# Patient Record
Sex: Male | Born: 1960 | Race: White | Hispanic: No | Marital: Married | Smoking: Current some day smoker
Health system: Southern US, Community
[De-identification: ages and names within clinical notes are randomized; demographics above are authoritative.]

## PROBLEM LIST (undated history)

## (undated) DIAGNOSIS — I10 Essential (primary) hypertension: Secondary | ICD-10-CM

## (undated) DIAGNOSIS — C801 Malignant (primary) neoplasm, unspecified: Secondary | ICD-10-CM

## (undated) DIAGNOSIS — E119 Type 2 diabetes mellitus without complications: Secondary | ICD-10-CM

## (undated) HISTORY — PX: OTHER SURGICAL HISTORY: SHX169

---

## 2007-06-13 ENCOUNTER — Emergency Department: Payer: Self-pay | Admitting: Emergency Medicine

## 2007-12-07 ENCOUNTER — Emergency Department: Payer: Self-pay | Admitting: Emergency Medicine

## 2007-12-07 ENCOUNTER — Other Ambulatory Visit: Payer: Self-pay

## 2007-12-23 ENCOUNTER — Emergency Department: Payer: Self-pay | Admitting: Emergency Medicine

## 2008-11-25 ENCOUNTER — Emergency Department: Payer: Self-pay | Admitting: Emergency Medicine

## 2008-12-01 ENCOUNTER — Observation Stay: Payer: Self-pay | Admitting: Internal Medicine

## 2008-12-06 ENCOUNTER — Ambulatory Visit: Payer: Self-pay | Admitting: Cardiovascular Disease

## 2011-12-25 ENCOUNTER — Ambulatory Visit: Payer: Self-pay | Admitting: Internal Medicine

## 2011-12-25 DIAGNOSIS — Z0289 Encounter for other administrative examinations: Secondary | ICD-10-CM

## 2012-05-05 ENCOUNTER — Ambulatory Visit: Payer: Self-pay | Admitting: Internal Medicine

## 2014-05-24 ENCOUNTER — Emergency Department: Payer: Self-pay | Admitting: Emergency Medicine

## 2014-05-24 LAB — CBC
HCT: 50.6 % (ref 40.0–52.0)
HGB: 17.2 g/dL (ref 13.0–18.0)
MCH: 31.5 pg (ref 26.0–34.0)
MCHC: 34 g/dL (ref 32.0–36.0)
MCV: 93 fL (ref 80–100)
Platelet: 181 10*3/uL (ref 150–440)
RBC: 5.46 10*6/uL (ref 4.40–5.90)
RDW: 13.1 % (ref 11.5–14.5)
WBC: 7.9 10*3/uL (ref 3.8–10.6)

## 2014-05-24 LAB — BASIC METABOLIC PANEL
ANION GAP: 5 — AB (ref 7–16)
BUN: 11 mg/dL (ref 7–18)
CO2: 31 mmol/L (ref 21–32)
Calcium, Total: 8.7 mg/dL (ref 8.5–10.1)
Chloride: 103 mmol/L (ref 98–107)
Creatinine: 1.42 mg/dL — ABNORMAL HIGH (ref 0.60–1.30)
EGFR (African American): >60
EGFR (Non-African Amer.): 55 — ABNORMAL LOW
Glucose: 145 mg/dL — ABNORMAL HIGH (ref 65–99)
Osmolality: 280 (ref 275–301)
Potassium: 4 mmol/L (ref 3.5–5.1)
Sodium: 139 mmol/L (ref 136–145)

## 2014-05-24 LAB — TROPONIN I

## 2014-10-23 ENCOUNTER — Other Ambulatory Visit: Payer: Self-pay

## 2014-10-23 ENCOUNTER — Encounter: Payer: Self-pay | Admitting: Emergency Medicine

## 2014-10-23 ENCOUNTER — Emergency Department: Payer: Medicare Other

## 2014-10-23 ENCOUNTER — Observation Stay
Admission: EM | Admit: 2014-10-23 | Discharge: 2014-10-25 | Disposition: A | Discharge status: Home / Self Care | Payer: Medicare Other | Attending: Internal Medicine | Admitting: Internal Medicine

## 2014-10-23 DIAGNOSIS — J9811 Atelectasis: Secondary | ICD-10-CM | POA: Diagnosis not present

## 2014-10-23 DIAGNOSIS — N289 Disorder of kidney and ureter, unspecified: Secondary | ICD-10-CM | POA: Diagnosis not present

## 2014-10-23 DIAGNOSIS — E669 Obesity, unspecified: Secondary | ICD-10-CM | POA: Insufficient documentation

## 2014-10-23 DIAGNOSIS — E876 Hypokalemia: Principal | ICD-10-CM

## 2014-10-23 DIAGNOSIS — Z7982 Long term (current) use of aspirin: Secondary | ICD-10-CM | POA: Diagnosis not present

## 2014-10-23 DIAGNOSIS — R531 Weakness: Secondary | ICD-10-CM | POA: Diagnosis present

## 2014-10-23 DIAGNOSIS — R42 Dizziness and giddiness: Secondary | ICD-10-CM | POA: Diagnosis not present

## 2014-10-23 DIAGNOSIS — R0602 Shortness of breath: Secondary | ICD-10-CM | POA: Diagnosis not present

## 2014-10-23 DIAGNOSIS — E785 Hyperlipidemia, unspecified: Secondary | ICD-10-CM | POA: Insufficient documentation

## 2014-10-23 DIAGNOSIS — R079 Chest pain, unspecified: Secondary | ICD-10-CM | POA: Diagnosis not present

## 2014-10-23 DIAGNOSIS — F17200 Nicotine dependence, unspecified, uncomplicated: Secondary | ICD-10-CM

## 2014-10-23 DIAGNOSIS — K219 Gastro-esophageal reflux disease without esophagitis: Secondary | ICD-10-CM | POA: Insufficient documentation

## 2014-10-23 DIAGNOSIS — E119 Type 2 diabetes mellitus without complications: Secondary | ICD-10-CM | POA: Diagnosis not present

## 2014-10-23 DIAGNOSIS — I10 Essential (primary) hypertension: Secondary | ICD-10-CM | POA: Diagnosis not present

## 2014-10-23 DIAGNOSIS — R0600 Dyspnea, unspecified: Secondary | ICD-10-CM | POA: Insufficient documentation

## 2014-10-23 DIAGNOSIS — G4733 Obstructive sleep apnea (adult) (pediatric): Secondary | ICD-10-CM | POA: Insufficient documentation

## 2014-10-23 DIAGNOSIS — R000 Tachycardia, unspecified: Secondary | ICD-10-CM

## 2014-10-23 DIAGNOSIS — Z859 Personal history of malignant neoplasm, unspecified: Secondary | ICD-10-CM | POA: Diagnosis not present

## 2014-10-23 HISTORY — DX: Malignant (primary) neoplasm, unspecified: C80.1

## 2014-10-23 HISTORY — DX: Essential (primary) hypertension: I10

## 2014-10-23 HISTORY — DX: Type 2 diabetes mellitus without complications: E11.9

## 2014-10-23 LAB — COMPREHENSIVE METABOLIC PANEL
ALBUMIN: 3.7 g/dL (ref 3.5–5.0)
ALK PHOS: 86 U/L (ref 38–126)
ALT: 16 U/L — ABNORMAL LOW (ref 17–63)
ANION GAP: 9 (ref 5–15)
AST: 22 U/L (ref 15–41)
BILIRUBIN TOTAL: 0.4 mg/dL (ref 0.3–1.2)
BUN: 15 mg/dL (ref 6–20)
CHLORIDE: 100 mmol/L — AB (ref 101–111)
CO2: 28 mmol/L (ref 22–32)
CREATININE: 1.28 mg/dL — AB (ref 0.61–1.24)
Calcium: 8.9 mg/dL (ref 8.9–10.3)
GFR calc Af Amer: >60 mL/min (ref >60)
GFR calc non Af Amer: >60 mL/min (ref >60)
Glucose, Bld: 304 mg/dL — ABNORMAL HIGH (ref 65–99)
Potassium: 2.5 mmol/L — CL (ref 3.5–5.1)
SODIUM: 137 mmol/L (ref 135–145)
TOTAL PROTEIN: 7.3 g/dL (ref 6.5–8.1)

## 2014-10-23 LAB — TROPONIN I

## 2014-10-23 LAB — CBC
HCT: 44.7 % (ref 40.0–52.0)
Hemoglobin: 15.7 g/dL (ref 13.0–18.0)
MCH: 32.2 pg (ref 26.0–34.0)
MCHC: 35.1 g/dL (ref 32.0–36.0)
MCV: 91.7 fL (ref 80.0–100.0)
PLATELETS: 190 10*3/uL (ref 150–440)
RBC: 4.87 MIL/uL (ref 4.40–5.90)
RDW: 13.4 % (ref 11.5–14.5)
WBC: 9.9 10*3/uL (ref 3.8–10.6)

## 2014-10-23 LAB — MAGNESIUM: MAGNESIUM: 1.6 mg/dL — AB (ref 1.7–2.4)

## 2014-10-23 MED ORDER — HEPARIN SODIUM (PORCINE) 5000 UNIT/ML IJ SOLN
INTRAMUSCULAR | Status: AC
  Administered 2014-10-24: 5000 [IU] via SUBCUTANEOUS
  Filled 2014-10-23: qty 1

## 2014-10-23 MED ORDER — INSULIN ASPART 100 UNIT/ML ~~LOC~~ SOLN
0.0000 [IU] | Freq: Every day | SUBCUTANEOUS | Status: DC
  Administered 2014-10-23: 4 [IU] via SUBCUTANEOUS
  Administered 2014-10-24: 3 [IU] via SUBCUTANEOUS
  Filled 2014-10-23: qty 4
  Filled 2014-10-23: qty 3

## 2014-10-23 MED ORDER — MORPHINE SULFATE 2 MG/ML IJ SOLN
2.0000 mg | INTRAMUSCULAR | Status: DC | PRN
  Administered 2014-10-23 – 2014-10-25 (×4): 2 mg via INTRAVENOUS
  Filled 2014-10-23 (×3): qty 1

## 2014-10-23 MED ORDER — TRAZODONE HCL 100 MG PO TABS
100.0000 mg | ORAL_TABLET | Freq: Every day | ORAL | Status: DC
  Administered 2014-10-24 (×2): 100 mg via ORAL
  Filled 2014-10-23 (×2): qty 1

## 2014-10-23 MED ORDER — MORPHINE SULFATE 2 MG/ML IJ SOLN
INTRAMUSCULAR | Status: AC
  Filled 2014-10-23: qty 1

## 2014-10-23 MED ORDER — ACETAMINOPHEN 650 MG RE SUPP: 650.0000 mg | Freq: Four times a day (QID) | RECTAL | Status: DC | PRN

## 2014-10-23 MED ORDER — ONDANSETRON HCL 4 MG/2ML IJ SOLN
4.0000 mg | Freq: Four times a day (QID) | INTRAMUSCULAR | Status: DC | PRN
  Administered 2014-10-23: 4 mg via INTRAVENOUS

## 2014-10-23 MED ORDER — SODIUM CHLORIDE 0.9 % IV BOLUS (SEPSIS)
1000.0000 mL | Freq: Once | INTRAVENOUS | Status: AC
  Administered 2014-10-23: 1000 mL via INTRAVENOUS

## 2014-10-23 MED ORDER — ASPIRIN 81 MG PO CHEW
324.0000 mg | CHEWABLE_TABLET | Freq: Once | ORAL | Status: AC
  Administered 2014-10-23: 324 mg via ORAL

## 2014-10-23 MED ORDER — NITROGLYCERIN 0.4 MG SL SUBL: 0.4000 mg | SUBLINGUAL_TABLET | SUBLINGUAL | Status: DC | PRN

## 2014-10-23 MED ORDER — GABAPENTIN 400 MG PO CAPS
800.0000 mg | ORAL_CAPSULE | Freq: Four times a day (QID) | ORAL | Status: DC
  Administered 2014-10-24 – 2014-10-25 (×6): 800 mg via ORAL
  Filled 2014-10-23 (×6): qty 2

## 2014-10-23 MED ORDER — ONDANSETRON HCL 4 MG/2ML IJ SOLN
INTRAMUSCULAR | Status: AC
  Administered 2014-10-23: 4 mg via INTRAVENOUS
  Filled 2014-10-23: qty 2

## 2014-10-23 MED ORDER — ACETAMINOPHEN 325 MG PO TABS
650.0000 mg | ORAL_TABLET | Freq: Four times a day (QID) | ORAL | Status: DC | PRN
  Administered 2014-10-24: 650 mg via ORAL
  Filled 2014-10-23: qty 2

## 2014-10-23 MED ORDER — INSULIN ASPART 100 UNIT/ML ~~LOC~~ SOLN
0.0000 [IU] | Freq: Three times a day (TID) | SUBCUTANEOUS | Status: DC
  Administered 2014-10-24 (×2): 5 [IU] via SUBCUTANEOUS
  Administered 2014-10-24: 8 [IU] via SUBCUTANEOUS
  Administered 2014-10-25: 5 [IU] via SUBCUTANEOUS
  Filled 2014-10-23: qty 8
  Filled 2014-10-23 (×3): qty 5

## 2014-10-23 MED ORDER — INSULIN ASPART 100 UNIT/ML ~~LOC~~ SOLN: 0.0000 [IU] | Freq: Every day | SUBCUTANEOUS | Status: DC

## 2014-10-23 MED ORDER — HEPARIN SODIUM (PORCINE) 5000 UNIT/ML IJ SOLN
5000.0000 [IU] | Freq: Three times a day (TID) | INTRAMUSCULAR | Status: DC
  Administered 2014-10-23 – 2014-10-25 (×6): 5000 [IU] via SUBCUTANEOUS
  Filled 2014-10-23 (×5): qty 1

## 2014-10-23 MED ORDER — ASPIRIN 81 MG PO CHEW
CHEWABLE_TABLET | ORAL | Status: AC
  Filled 2014-10-23: qty 4

## 2014-10-23 MED ORDER — ATORVASTATIN CALCIUM 20 MG PO TABS
40.0000 mg | ORAL_TABLET | Freq: Every day | ORAL | Status: DC
  Administered 2014-10-24: 40 mg via ORAL
  Filled 2014-10-23: qty 2

## 2014-10-23 MED ORDER — POTASSIUM CHLORIDE CRYS ER 20 MEQ PO TBCR
40.0000 meq | EXTENDED_RELEASE_TABLET | Freq: Once | ORAL | Status: AC
  Administered 2014-10-23: 40 meq via ORAL

## 2014-10-23 MED ORDER — POTASSIUM CHLORIDE CRYS ER 20 MEQ PO TBCR
40.0000 meq | EXTENDED_RELEASE_TABLET | ORAL | Status: AC
  Administered 2014-10-23: 40 meq via ORAL

## 2014-10-23 MED ORDER — POTASSIUM CHLORIDE CRYS ER 20 MEQ PO TBCR
EXTENDED_RELEASE_TABLET | ORAL | Status: AC
  Administered 2014-10-23: 40 meq via ORAL
  Filled 2014-10-23: qty 2

## 2014-10-23 MED ORDER — INSULIN ASPART 100 UNIT/ML ~~LOC~~ SOLN: 0.0000 [IU] | Freq: Three times a day (TID) | SUBCUTANEOUS | Status: DC

## 2014-10-23 MED ORDER — PANTOPRAZOLE SODIUM 40 MG PO TBEC
40.0000 mg | DELAYED_RELEASE_TABLET | Freq: Every day | ORAL | Status: DC
  Administered 2014-10-24 – 2014-10-25 (×2): 40 mg via ORAL
  Filled 2014-10-23 (×2): qty 1

## 2014-10-23 MED ORDER — MOMETASONE FURO-FORMOTEROL FUM 100-5 MCG/ACT IN AERO
2.0000 | INHALATION_SPRAY | Freq: Two times a day (BID) | RESPIRATORY_TRACT | Status: DC
  Administered 2014-10-24 – 2014-10-25 (×4): 2 via RESPIRATORY_TRACT
  Filled 2014-10-23: qty 8.8

## 2014-10-23 MED ORDER — ASPIRIN EC 81 MG PO TBEC
81.0000 mg | DELAYED_RELEASE_TABLET | Freq: Every day | ORAL | Status: DC
  Administered 2014-10-24 – 2014-10-25 (×2): 81 mg via ORAL
  Filled 2014-10-23 (×2): qty 1

## 2014-10-23 MED ORDER — ONDANSETRON HCL 4 MG PO TABS: 4.0000 mg | ORAL_TABLET | Freq: Four times a day (QID) | ORAL | Status: DC | PRN

## 2014-10-23 MED ORDER — IOHEXOL 350 MG/ML SOLN
100.0000 mL | Freq: Once | INTRAVENOUS | Status: AC | PRN
  Administered 2014-10-23: 75 mL via INTRAVENOUS

## 2014-10-23 MED ORDER — POTASSIUM CHLORIDE CRYS ER 20 MEQ PO TBCR
EXTENDED_RELEASE_TABLET | ORAL | Status: AC
  Filled 2014-10-23: qty 2

## 2014-10-23 MED ORDER — TRAMADOL HCL 50 MG PO TABS
50.0000 mg | ORAL_TABLET | Freq: Four times a day (QID) | ORAL | Status: DC | PRN
  Administered 2014-10-24 – 2014-10-25 (×3): 50 mg via ORAL
  Filled 2014-10-23 (×3): qty 1

## 2014-10-23 MED ORDER — SODIUM CHLORIDE 0.9 % IJ SOLN
3.0000 mL | Freq: Two times a day (BID) | INTRAMUSCULAR | Status: DC
  Administered 2014-10-23 – 2014-10-25 (×4): 3 mL via INTRAVENOUS

## 2014-10-23 MED ORDER — ASPIRIN 81 MG PO CHEW: 324.0000 mg | CHEWABLE_TABLET | Freq: Once | ORAL | Status: DC

## 2014-10-23 NOTE — ED Notes (Signed)
Critical potassium called to Dr Jacqualine Code who will be pt's provider; report also given to [facility], RN
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
## 2014-10-23 NOTE — ED Notes (Signed)
States has work up at [employer] but left without being seen.

## 2014-10-23 NOTE — H&P (Signed)
[facility] [place] NAME: Patrick Yoder    MR#:  [id number]  DATE OF BIRTH:  [birth year]   DATE OF ADMISSION:  10/23/2014  PRIMARY CARE PHYSICIAN: No primary care provider on file.   REQUESTING/REFERRING PHYSICIAN: Delman Kitten  CHIEF COMPLAINT:   Chief Complaint  Patient presents with  . Chest Pain    intermittent x several weeks    HISTORY OF PRESENT ILLNESS:  Patrick Yoder  is a 54 y.o. male with a known history of type 2 diabetes, uncomplicated, essential hypertension presenting with chest pain. Describes one-week total duration of chest pain retrosternal in location, nonexertional, pressure in quality, nonradiating, 7-8/10 intensity, no worsening or relieving factors. He had associated shortness of breath otherwise no further symptomatology. Of note patient supposed to have a stress test performed in about a week as an outpatient, has no cardiologist.  PAST MEDICAL HISTORY:   Past Medical History  Diagnosis Date  . Diabetes mellitus without complication   . Hypertension   . Cancer     PAST SURGICAL HISTORY:   Past Surgical History  Procedure Laterality Date  . Renal cancer      SOCIAL HISTORY:   History  Substance Use Topics  . Smoking status: Current Some Day Smoker  . Smokeless tobacco: Not on file  . Alcohol Use: No    FAMILY HISTORY:  No family history on file.  DRUG ALLERGIES:  No Known Allergies  REVIEW OF SYSTEMS:  REVIEW OF SYSTEMS:  CONSTITUTIONAL: Denies fevers, chills, fatigue, weakness.  EYES: Denies blurred vision, double vision, or eye pain.  EARS, NOSE, THROAT: Denies tinnitus, ear pain, hearing loss.  RESPIRATORY: denies cough, positive shortness of breath, denies wheezing  CARDIOVASCULAR: Positive chest pain, denies palpitations, edema.  GASTROINTESTINAL: Denies nausea, vomiting, diarrhea, abdominal pain.  GENITOURINARY: Denies dysuria, hematuria.  ENDOCRINE: Denies nocturia or  thyroid problems. HEMATOLOGIC AND LYMPHATIC: Denies easy bruising or bleeding.  SKIN: Denies rash or lesions.  MUSCULOSKELETAL: Denies pain in neck, back, shoulder, knees, hips, or further arthritic symptoms.  NEUROLOGIC: Denies paralysis, paresthesias.  PSYCHIATRIC: Denies anxiety or depressive symptoms. Otherwise full review of systems performed by me is negative.   MEDICATIONS AT HOME:   Prior to Admission medications   Medication Sig Start Date End Date Taking? Authorizing Provider  albuterol (PROVENTIL HFA;VENTOLIN HFA) 108 (90 BASE) MCG/ACT inhaler Inhale into the lungs every 4 (four) hours as needed for wheezing or shortness of breath.   Yes Historical Provider, MD  amoxicillin-clavulanate (AUGMENTIN) 875-125 MG per tablet Take 1 tablet by mouth 2 (two) times daily. 10/23/14 11/01/14 Yes Historical Provider, MD  aspirin 81 MG tablet Take 81 mg by mouth daily.   Yes Historical Provider, MD  chlorthalidone (HYGROTON) 25 MG tablet Take 25 mg by mouth daily.   Yes Historical Provider, MD  Fluticasone-Salmeterol (ADVAIR) 250-50 MCG/DOSE AEPB Inhale 1 puff into the lungs 2 (two) times daily.   Yes Historical Provider, MD  gabapentin (NEURONTIN) 800 MG tablet Take 800 mg by mouth 4 (four) times daily.   Yes Historical Provider, MD  metFORMIN (GLUCOPHAGE) 1000 MG tablet Take 1,000 mg by mouth 2 (two) times daily with a meal.   Yes Historical Provider, MD  omeprazole (PRILOSEC) 20 MG capsule Take 20 mg by mouth 2 (two) times daily before a meal.   Yes Historical Provider, MD  traMADol (ULTRAM) 50 MG tablet Take by mouth every 6 (six) hours as needed for moderate pain.  Yes Historical Provider, MD  traZODone (DESYREL) 100 MG tablet Take 100 mg by mouth at bedtime.   Yes Historical Provider, MD      VITAL SIGNS:  Blood pressure 152/98, pulse 117, temperature 97.9 F (36.6 C), temperature source Oral, resp. rate 18, height 6\' 2"  (1.88 m), weight 295 lb (133.811 kg), SpO2 94 %.  PHYSICAL  EXAMINATION:  VITAL SIGNS: Filed Vitals:   10/23/14 2016  BP: 152/98  Pulse: 117  Temp:   Resp: 18   GENERAL:54 y.o.male currently in no acute distress, obese.  HEAD: Normocephalic, atraumatic.  EYES: Pupils equal, round, reactive to light. Extraocular muscles intact. No scleral icterus.  MOUTH: Moist mucosal membrane. Dentition intact. No abscess noted.  EAR, NOSE, THROAT: Clear without exudates. No external lesions.  NECK: Supple. No thyromegaly. No nodules. No JVD.  PULMONARY: Clear to ascultation, without wheeze rails or rhonci. No use of accessory muscles, Good respiratory effort. good air entry bilaterally CHEST: Nontender to palpation.  CARDIOVASCULAR: S1 and S2. Regular rate and rhythm. No murmurs, rubs, or gallops. No edema. Pedal pulses 2+ bilaterally.  GASTROINTESTINAL: Soft, nontender, nondistended. No masses. Positive bowel sounds. No hepatosplenomegaly.  MUSCULOSKELETAL: No swelling, clubbing, or edema. Range of motion full in all extremities.  NEUROLOGIC: Cranial nerves II through XII are intact. No gross focal neurological deficits. Sensation intact. Reflexes intact.  SKIN: No ulceration, lesions, rashes, or cyanosis. Skin warm and dry. Turgor intact.  PSYCHIATRIC: Mood, affect within normal limits. The patient is awake, alert and oriented x 3. Insight, judgment intact.    LABORATORY PANEL:   CBC  Recent Labs Lab 10/23/14 1913  WBC 9.9  HGB 15.7  HCT 44.7  PLT 190   ------------------------------------------------------------------------------------------------------------------  Chemistries   Recent Labs Lab 10/23/14 1913  NA 137  K 2.5*  CL 100*  CO2 28  GLUCOSE 304*  BUN 15  CREATININE 1.28*  CALCIUM 8.9  AST 22  ALT 16*  ALKPHOS 86  BILITOT 0.4   ------------------------------------------------------------------------------------------------------------------  Cardiac Enzymes  Recent Labs Lab 10/23/14 1913  TROPONINI <0.03    ------------------------------------------------------------------------------------------------------------------  RADIOLOGY:  Dg Chest 2 View  10/23/2014   CLINICAL DATA:  54 year old male with intermittent chest pain for 3 weeks, increasing today. Initial encounter.  EXAM: CHEST  2 VIEW  COMPARISON:  12/01/2008  FINDINGS: The right lung base is not entirely included on the PA view. Lung volumes are stable and within normal limits. Normal cardiac size and mediastinal contours. Visualized tracheal air column is within normal limits. No pneumothorax or pulmonary edema. No pleural effusion or confluent pulmonary opacity. No acute osseous abnormality identified.  IMPRESSION: No acute cardiopulmonary abnormality.   Electronically Signed   By: Genevie Ann M.D.   On: 10/23/2014 19:31   Ct Angio Chest Pe W/cm &/or Wo Cm  10/23/2014   CLINICAL DATA:  Chest pain and shortness of breath  EXAM: CT ANGIOGRAPHY CHEST WITH CONTRAST  TECHNIQUE: Multidetector CT imaging of the chest was performed using the standard protocol during bolus administration of intravenous contrast. Multiplanar CT image reconstructions and MIPs were obtained to evaluate the vascular anatomy.  CONTRAST:  45mL OMNIPAQUE IOHEXOL 350 MG/ML SOLN  COMPARISON:  Chest radiograph October 23, 2014  FINDINGS: There is no demonstrable pulmonary embolus. There is no thoracic aortic aneurysm or dissection. Visualized great vessels appear unremarkable.  There is mild dependent atelectasis in the left and to a lesser extent right lung bases. There is no lung edema or consolidation. There is slight lower lobe  bronchiectatic change bilaterally.  Thyroid appears unremarkable. There is no demonstrable adenopathy. The pericardium is not thickened.  Visualized upper abdominal structures appear unremarkable.  There are no blastic or lytic bone lesions. There is degenerative change in mid thoracic region.  Review of the MIP images confirms the above findings.  IMPRESSION:  No edema or consolidation.  No demonstrable pulmonary embolus.   Electronically Signed   By: Lowella Grip III M.D.   On: 10/23/2014 21:37    EKG:   Orders placed or performed during the hospital encounter of 10/23/14  . ED EKG (<42mins upon arrival to the ED)  . ED EKG (<21mins upon arrival to the ED)    IMPRESSION AND PLAN:   53 year old Caucasian gentleman history of type 2 diabetes uncomplicated, essential hypertension presenting with chest pain.  1.Chest pain, central: Initiate aspirin and statin therapy, admitted to telemetry, trend cardiac enzymes 3,  if continued elevation will initiate heparin drip ,nitroglycerin when necessary, morphine when necessary, consult cardiology. 2. Hypokalemia: Check magnesium level, replace potassium: 4-5 3. Type 2 diabetes uncomplicated: Hold oral agents at insulin sliding scale 4. GERD without esophagitis: PPI therapy 5. Essential hypertension: Hold diuretics given hypokalemia 6. Venous thromboembolism prophylactic: Heparin subcutaneous     All the records are reviewed and case discussed with ED provider. Management plans discussed with the patient, family and they are in agreement.  CODE STATUS: Full  TOTAL TIME TAKING CARE OF THIS PATIENT: 35 minutes.    Patrick Yoder,  Karenann Cai.D on 10/23/2014 at 10:58 PM  Between 7am to 6pm - Pager - [phone]  After 6pm: House Pager: - [phone]  [facility]  Office  [phone]  CC: Primary care physician; No primary care provider on file.

## 2014-10-24 DIAGNOSIS — E119 Type 2 diabetes mellitus without complications: Secondary | ICD-10-CM | POA: Diagnosis not present

## 2014-10-24 DIAGNOSIS — F17200 Nicotine dependence, unspecified, uncomplicated: Secondary | ICD-10-CM

## 2014-10-24 DIAGNOSIS — N289 Disorder of kidney and ureter, unspecified: Secondary | ICD-10-CM

## 2014-10-24 DIAGNOSIS — E669 Obesity, unspecified: Secondary | ICD-10-CM

## 2014-10-24 DIAGNOSIS — E876 Hypokalemia: Secondary | ICD-10-CM | POA: Diagnosis not present

## 2014-10-24 DIAGNOSIS — R079 Chest pain, unspecified: Secondary | ICD-10-CM | POA: Diagnosis not present

## 2014-10-24 LAB — BASIC METABOLIC PANEL
ANION GAP: 8 (ref 5–15)
ANION GAP: 8 (ref 5–15)
BUN: 12 mg/dL (ref 6–20)
BUN: 16 mg/dL (ref 6–20)
CHLORIDE: 98 mmol/L — AB (ref 101–111)
CO2: 27 mmol/L (ref 22–32)
CO2: 29 mmol/L (ref 22–32)
CREATININE: 1.27 mg/dL — AB (ref 0.61–1.24)
Calcium: 8.7 mg/dL — ABNORMAL LOW (ref 8.9–10.3)
Calcium: 8.7 mg/dL — ABNORMAL LOW (ref 8.9–10.3)
Chloride: 101 mmol/L (ref 101–111)
Creatinine, Ser: 1.17 mg/dL (ref 0.61–1.24)
GFR calc Af Amer: >60 mL/min (ref >60)
GFR calc non Af Amer: >60 mL/min (ref >60)
GLUCOSE: 273 mg/dL — AB (ref 65–99)
GLUCOSE: 313 mg/dL — AB (ref 65–99)
POTASSIUM: 3.8 mmol/L (ref 3.5–5.1)
Potassium: 2.8 mmol/L — CL (ref 3.5–5.1)
SODIUM: 136 mmol/L (ref 135–145)
SODIUM: 135 mmol/L (ref 135–145)

## 2014-10-24 LAB — GLUCOSE, CAPILLARY
GLUCOSE-CAPILLARY: 264 mg/dL — AB (ref 65–99)
Glucose-Capillary: 244 mg/dL — ABNORMAL HIGH (ref 65–99)
Glucose-Capillary: 247 mg/dL — ABNORMAL HIGH (ref 65–99)
Glucose-Capillary: 284 mg/dL — ABNORMAL HIGH (ref 65–99)

## 2014-10-24 LAB — TROPONIN I
Troponin I: <0.03 ng/mL (ref <0.031)
Troponin I: <0.03 ng/mL (ref <0.031)

## 2014-10-24 LAB — MAGNESIUM: Magnesium: 2.1 mg/dL (ref 1.7–2.4)

## 2014-10-24 LAB — TSH: TSH: 1.447 u[IU]/mL (ref 0.350–4.500)

## 2014-10-24 MED ORDER — METOPROLOL TARTRATE 25 MG PO TABS: 25.0000 mg | ORAL_TABLET | Freq: Two times a day (BID) | ORAL | Status: AC

## 2014-10-24 MED ORDER — MAGNESIUM OXIDE 400 (241.3 MG) MG PO TABS
400.0000 mg | ORAL_TABLET | Freq: Two times a day (BID) | ORAL | Status: DC
  Administered 2014-10-24 – 2014-10-25 (×3): 400 mg via ORAL
  Filled 2014-10-24 (×3): qty 1

## 2014-10-24 MED ORDER — ATORVASTATIN CALCIUM 40 MG PO TABS: 40.0000 mg | ORAL_TABLET | Freq: Every day | ORAL | Status: AC

## 2014-10-24 MED ORDER — POTASSIUM CHLORIDE CRYS ER 20 MEQ PO TBCR: 40.0000 meq | EXTENDED_RELEASE_TABLET | Freq: Four times a day (QID) | ORAL | Status: DC

## 2014-10-24 MED ORDER — MAGNESIUM OXIDE 400 (241.3 MG) MG PO TABS: 400.0000 mg | ORAL_TABLET | Freq: Two times a day (BID) | ORAL | Status: AC

## 2014-10-24 MED ORDER — ISOSORBIDE MONONITRATE ER 30 MG PO TB24
30.0000 mg | ORAL_TABLET | Freq: Every day | ORAL | Status: DC
  Administered 2014-10-24 – 2014-10-25 (×2): 30 mg via ORAL
  Filled 2014-10-24 (×2): qty 1

## 2014-10-24 MED ORDER — ASPIRIN 81 MG PO TBEC: 81.0000 mg | DELAYED_RELEASE_TABLET | Freq: Every day | ORAL | Status: DC

## 2014-10-24 MED ORDER — NITROGLYCERIN 0.4 MG SL SUBL: 0.4000 mg | SUBLINGUAL_TABLET | SUBLINGUAL | Status: AC | PRN

## 2014-10-24 MED ORDER — ISOSORBIDE MONONITRATE ER 30 MG PO TB24: 30.0000 mg | ORAL_TABLET | Freq: Every day | ORAL | Status: AC

## 2014-10-24 MED ORDER — METOPROLOL TARTRATE 25 MG PO TABS
25.0000 mg | ORAL_TABLET | Freq: Two times a day (BID) | ORAL | Status: DC
  Administered 2014-10-24 – 2014-10-25 (×3): 25 mg via ORAL
  Filled 2014-10-24 (×3): qty 1

## 2014-10-24 MED ORDER — POTASSIUM CHLORIDE CRYS ER 20 MEQ PO TBCR
40.0000 meq | EXTENDED_RELEASE_TABLET | Freq: Once | ORAL | Status: AC
  Administered 2014-10-24: 40 meq via ORAL
  Filled 2014-10-24: qty 2

## 2014-10-24 MED ORDER — POTASSIUM CHLORIDE 10 MEQ/100ML IV SOLN
10.0000 meq | INTRAVENOUS | Status: AC
  Administered 2014-10-24 (×4): 10 meq via INTRAVENOUS
  Filled 2014-10-24 (×4): qty 100

## 2014-10-24 MED ORDER — POTASSIUM CHLORIDE CRYS ER 20 MEQ PO TBCR
40.0000 meq | EXTENDED_RELEASE_TABLET | ORAL | Status: AC
  Administered 2014-10-24: 40 meq via ORAL
  Filled 2014-10-24: qty 2

## 2014-10-24 MED ORDER — NICOTINE 10 MG IN INHA
1.0000 | RESPIRATORY_TRACT | Status: DC | PRN
  Administered 2014-10-24: 1 via RESPIRATORY_TRACT
  Filled 2014-10-24: qty 36

## 2014-10-24 MED ORDER — MAGNESIUM SULFATE 2 GM/50ML IV SOLN
2.0000 g | Freq: Once | INTRAVENOUS | Status: AC
  Administered 2014-10-24: 2 g via INTRAVENOUS
  Filled 2014-10-24: qty 50

## 2014-10-24 NOTE — Care Management Note (Signed)
Case Management Note  Patient Details  Name: Patrick Yoder MRN: [id number] Date of Birth: 03/30/1961  Subjective/Objective:  Medicare Observation letter reviewed with patient, verbalized understanding, signed, copy given, original placed on chart.                  Action/Plan:   Expected Discharge Date:                  Expected Discharge Plan:     In-House Referral:     Discharge planning Services     Post Acute Care Choice:    Choice offered to:     DME Arranged:    DME Agency:     HH Arranged:    [place] Agency:     Status of Service:     Medicare Important Message Given:    Date Medicare IM Given:    Medicare IM give by:    Date Additional Medicare IM Given:    Additional Medicare Important Message give by:     If discussed at [place] of Stay Meetings, dates discussed:    Additional Comments:  Ival Bible, RN 10/24/2014, 9:04 AM

## 2014-10-24 NOTE — Consult Note (Signed)
Primary Physician: Primary Cardiologist:  New    Asked to see re CP  HPI: Patinet is a 54 yo with history of HTN, Type II DM, HL, sleep apnea  Presented with CP yesterday to [facility]  Patient says that problems with CP began about 3 to 4 wks ago  Episodes occur all across chest .  Last week he was driving  Developed R shoulder pain that radiated down to mid chest  Felt dizzy  Almost blacked out.   Eased some but then had dull discomfort for awhile. Pain can be pleuritic    Patient says overall he doesn't feel good  Gets SOB with activity easily  Some PND  Does use CPAP.  No energy. Is troubled by rheumatoid arthitis  Followed at [facility]  Had injection of lower back a few months ago  Pain/numbness goes down leg  Overall frustrated by lack of help.  At admit, a CT angiogram of chest showed no PE  Patient is sched for stress test this week in [facility]        Past Medical History  Diagnosis Date  . Diabetes mellitus without complication   . Hypertension   . Cancer     Medications Prior to Admission  Medication Sig Dispense Refill  . albuterol (PROVENTIL HFA;VENTOLIN HFA) 108 (90 BASE) MCG/ACT inhaler Inhale into the lungs every 4 (four) hours as needed for wheezing or shortness of breath.    Marland Kitchen amoxicillin-clavulanate (AUGMENTIN) 875-125 MG per tablet Take 1 tablet by mouth 2 (two) times daily.    Marland Kitchen aspirin 81 MG tablet Take 81 mg by mouth daily.    . chlorthalidone (HYGROTON) 25 MG tablet Take 25 mg by mouth daily.    . Fluticasone-Salmeterol (ADVAIR) 250-50 MCG/DOSE AEPB Inhale 1 puff into the lungs 2 (two) times daily.    Marland Kitchen gabapentin (NEURONTIN) 800 MG tablet Take 800 mg by mouth 4 (four) times daily.    . metFORMIN (GLUCOPHAGE) 1000 MG tablet Take 1,000 mg by mouth 2 (two) times daily with a meal.    . omeprazole (PRILOSEC) 20 MG capsule Take 20 mg by mouth 2 (two) times daily before a meal.    . traMADol (ULTRAM) 50 MG tablet Take by mouth every 6 (six) hours as needed  for moderate pain.    . traZODone (DESYREL) 100 MG tablet Take 100 mg by mouth at bedtime.       Marland Kitchen aspirin EC  81 mg Oral Daily  . atorvastatin  40 mg Oral q1800  . gabapentin  800 mg Oral QID  . heparin  5,000 Units Subcutaneous 3 times per day  . insulin aspart  0-15 Units Subcutaneous TID WC  . insulin aspart  0-5 Units Subcutaneous QHS  . isosorbide mononitrate  30 mg Oral Daily  . magnesium oxide  400 mg Oral BID  . metoprolol tartrate  25 mg Oral BID  . mometasone-formoterol  2 puff Inhalation BID  . pantoprazole  40 mg Oral Daily  . potassium chloride  10 mEq Intravenous Q1 Hr x 4  . potassium chloride  40 mEq Oral Q4H  . sodium chloride  3 mL Intravenous Q12H  . traZODone  100 mg Oral QHS    Infusions:    No Known Allergies  History   Social History  . Marital Status: Married    Spouse Name: N/A  . Number of Children: N/A  . Years of Education: N/A   Occupational History  . Not  on file.   Social History Main Topics  . Smoking status: Current Some Day Smoker  . Smokeless tobacco: Not on file  . Alcohol Use: No  . Drug Use: Not on file  . Sexual Activity: Not on file   Other Topics Concern  . Not on file   Social History Narrative  . No narrative on file    History reviewed. No pertinent family history.  REVIEW OF SYSTEMS:  All systems reviewed  Negative to the above problem except as noted above.    PHYSICAL EXAM: Filed Vitals:   10/24/14 1112  BP: 119/76  Pulse: 101  Temp: 97.7 F (36.5 C)  Resp: 24     Intake/Output Summary (Last 24 hours) at 10/24/14 1220 Last data filed at 10/24/14 1100  Gross per 24 hour  Intake    240 ml  Output      0 ml  Net    240 ml    General: Obese 54 yo in NAD   HEENT: normal Neck: supple. no JVD. Carotids 2+ bilat; no bruits. No lymphadenopathy or thryomegaly appreciated. Cor: PMI nondisplaced. Regular rate & rhythm. No rubs, gallops or murmurs. Lungs: clear Abdomen: soft, nontender, nondistended. No  hepatosplenomegaly. No bruits or masses. Good bowel sounds. Extremities: no cyanosis, clubbing, rash, edema Neuro: alert & oriented x 3, cranial nerves grossly intact. moves all 4 extremities w/o difficulty. Affect pleasant.  ECG:  6/19  ST  114 bpm    Results for orders placed or performed during the hospital encounter of 10/23/14 (from the past 24 hour(s))  CBC     Status: None   Collection Time: 10/23/14  7:13 PM  Result Value Ref Range   WBC 9.9 3.8 - 10.6 K/uL   RBC 4.87 4.40 - 5.90 MIL/uL   Hemoglobin 15.7 13.0 - 18.0 g/dL   HCT 44.7 40.0 - 52.0 %   MCV 91.7 80.0 - 100.0 fL   MCH 32.2 26.0 - 34.0 pg   MCHC 35.1 32.0 - 36.0 g/dL   RDW 13.4 11.5 - 14.5 %   Platelets 190 150 - 440 K/uL  Troponin I     Status: None   Collection Time: 10/23/14  7:13 PM  Result Value Ref Range   Troponin I <0.03 <0.031 ng/mL  Comprehensive metabolic panel     Status: Abnormal   Collection Time: 10/23/14  7:13 PM  Result Value Ref Range   Sodium 137 135 - 145 mmol/L   Potassium 2.5 (LL) 3.5 - 5.1 mmol/L   Chloride 100 (L) 101 - 111 mmol/L   CO2 28 22 - 32 mmol/L   Glucose, Bld 304 (H) 65 - 99 mg/dL   BUN 15 6 - 20 mg/dL   Creatinine, Ser 1.28 (H) 0.61 - 1.24 mg/dL   Calcium 8.9 8.9 - 10.3 mg/dL   Total Protein 7.3 6.5 - 8.1 g/dL   Albumin 3.7 3.5 - 5.0 g/dL   AST 22 15 - 41 U/L   ALT 16 (L) 17 - 63 U/L   Alkaline Phosphatase 86 38 - 126 U/L   Total Bilirubin 0.4 0.3 - 1.2 mg/dL   GFR calc non Af Amer >60 >60 mL/min   GFR calc Af Amer >60 >60 mL/min   Anion gap 9 5 - 15  Magnesium     Status: Abnormal   Collection Time: 10/23/14  7:13 PM  Result Value Ref Range   Magnesium 1.6 (L) 1.7 - 2.4 mg/dL  Basic metabolic panel  Status: Abnormal   Collection Time: 10/23/14 11:52 PM  Result Value Ref Range   Sodium 135 135 - 145 mmol/L   Potassium 2.8 (LL) 3.5 - 5.1 mmol/L   Chloride 98 (L) 101 - 111 mmol/L   CO2 29 22 - 32 mmol/L   Glucose, Bld 313 (H) 65 - 99 mg/dL   BUN 16 6 - 20  mg/dL   Creatinine, Ser 1.17 0.61 - 1.24 mg/dL   Calcium 8.7 (L) 8.9 - 10.3 mg/dL   GFR calc non Af Amer >60 >60 mL/min   GFR calc Af Amer >60 >60 mL/min   Anion gap 8 5 - 15  Troponin I (q 6hr x 3)     Status: None   Collection Time: 10/23/14 11:52 PM  Result Value Ref Range   Troponin I <0.03 <0.031 ng/mL  Troponin I (q 6hr x 3)     Status: None   Collection Time: 10/24/14  6:09 AM  Result Value Ref Range   Troponin I <0.03 <0.031 ng/mL  Glucose, capillary     Status: Abnormal   Collection Time: 10/24/14  7:25 AM  Result Value Ref Range   Glucose-Capillary 264 (H) 65 - 99 mg/dL   Comment 1 Notify RN    Comment 2 Document in Chart   Glucose, capillary     Status: Abnormal   Collection Time: 10/24/14 11:09 AM  Result Value Ref Range   Glucose-Capillary 244 (H) 65 - 99 mg/dL   Comment 1 Notify RN    Comment 2 Document in Chart   Troponin I (q 6hr x 3)     Status: None   Collection Time: 10/24/14 11:23 AM  Result Value Ref Range   Troponin I <0.03 <0.031 ng/mL   Dg Chest 2 View  10/23/2014   CLINICAL DATA:  54 year old male with intermittent chest pain for 3 weeks, increasing today. Initial encounter.  EXAM: CHEST  2 VIEW  COMPARISON:  12/01/2008  FINDINGS: The right lung base is not entirely included on the PA view. Lung volumes are stable and within normal limits. Normal cardiac size and mediastinal contours. Visualized tracheal air column is within normal limits. No pneumothorax or pulmonary edema. No pleural effusion or confluent pulmonary opacity. No acute osseous abnormality identified.  IMPRESSION: No acute cardiopulmonary abnormality.   Electronically Signed   By: Genevie Ann M.D.   On: 10/23/2014 19:31   Ct Angio Chest Pe W/cm &/or Wo Cm  10/23/2014   CLINICAL DATA:  Chest pain and shortness of breath  EXAM: CT ANGIOGRAPHY CHEST WITH CONTRAST  TECHNIQUE: Multidetector CT imaging of the chest was performed using the standard protocol during bolus administration of intravenous  contrast. Multiplanar CT image reconstructions and MIPs were obtained to evaluate the vascular anatomy.  CONTRAST:  10mL OMNIPAQUE IOHEXOL 350 MG/ML SOLN  COMPARISON:  Chest radiograph October 23, 2014  FINDINGS: There is no demonstrable pulmonary embolus. There is no thoracic aortic aneurysm or dissection. Visualized great vessels appear unremarkable.  There is mild dependent atelectasis in the left and to a lesser extent right lung bases. There is no lung edema or consolidation. There is slight lower lobe bronchiectatic change bilaterally.  Thyroid appears unremarkable. There is no demonstrable adenopathy. The pericardium is not thickened.  Visualized upper abdominal structures appear unremarkable.  There are no blastic or lytic bone lesions. There is degenerative change in mid thoracic region.  Review of the MIP images confirms the above findings.  IMPRESSION: No edema or consolidation.  No demonstrable pulmonary embolus.   Electronically Signed   By: Lowella Grip III M.D.   On: 10/23/2014 21:37     ASSESSMENT:  1.  CP  Atypical for ischemia  Appears prob more muscular or pleuritic.   Note K was extremely low on admit.   This could exacerbate above symptoms   More concerning is dyspnea with exertion and some PND> I agree with stress testing  I would also recomm echo to evaluate systolic and diastolic function as well as estimate pulmonary pressures  Given pt option for d/c  He wants to get questions answered  WOuld like to stay  And get done  Will order  2.  Dyspnea  As above  Plus Continue CPAP  3.  HTN  Pts K was extremely low on chlorathalidone which he was on at home  Now on imdur and metoprolol    BP is OK  HR is still a little high  Again, echo pending.    4.  DM  Per internal medicine  Pt admits to eating anything he wants  Need dietary to see  5  HL  Now on statin  Lipids will need to be followed as outpt  Check in AM    6.  Sleep apnea  Continue CPAP  7  Musculskel  Dx of RA   8   Hypokalemia  K is being repleted with IV potassium  WIll need to follow  Check MG

## 2014-10-24 NOTE — Progress Notes (Signed)
Per Dr. Ether Griffins, pt up and ambulated around nursing station several times.  NO occurrence of CP, SHOB, or any other S/S.

## 2014-10-24 NOTE — Progress Notes (Signed)
PT. Arrived to unit via stretcher. Pt. Walked to bed. Pt. orientated to room and staff. Admission pamphlet given to pt. Skin assessment completed with verifying nurse Glendale Chard, RN. Skin is clean, dry and intact. Pt. Has multiple scars to LUQ, LLQ and LLE. All scars are clean, dry and intact with no redness, edema, drainage or odor noted. TEDS in place. Pt. A&O x4. Wife at bedside. No acute distress noted. No SOB observed. Breaths are even and unlabored. K+ continues to be low at 2.8, Dr. Lavetta Nielsen called and an order for 56mEq of K+ ordered and administered to pt. I.V. Mag was also given for low mag level. Pt continues to be NSR on tele. Will continue to monitor pt.

## 2014-10-24 NOTE — Progress Notes (Signed)
[facility] [place] NAME: Patrick Yoder    MR#:  [id number]  DATE OF BIRTH:  12/05/1960  SUBJECTIVE:  CHIEF COMPLAINT:   Chief Complaint  Patient presents with  . Chest Pain    intermittent x several weeks   Feels good today. No chest pains today , but admits of having chest pains intermittently at rest as well as on exertion, not related to exertion. Admits of smoking and eating inappropriately  Review of Systems  Constitutional: Negative for fever, chills and weight loss.  HENT: Negative for congestion.   Eyes: Negative for blurred vision and double vision.  Respiratory: Negative for cough, sputum production, shortness of breath and wheezing.   Cardiovascular: Negative for chest pain, palpitations, orthopnea, leg swelling and PND.  Gastrointestinal: Negative for nausea, vomiting, abdominal pain, diarrhea, constipation and blood in stool.  Genitourinary: Negative for dysuria, urgency, frequency and hematuria.  Musculoskeletal: Negative for falls.  Neurological: Negative for dizziness, tremors, focal weakness and headaches.  Endo/Heme/Allergies: Does not bruise/bleed easily.  Psychiatric/Behavioral: Negative for depression. The patient does not have insomnia.     VITAL SIGNS: Blood pressure 137/81, pulse 110, temperature 97.5 F (36.4 C), temperature source Oral, resp. rate 20, height 6\' 2"  (1.88 m), weight 133.539 kg (294 lb 6.4 oz), SpO2 94 %.  PHYSICAL EXAMINATION:   GENERAL:  54 y.o.-year-old obese patient sitting on the edge of the bed in no acute distress.  EYES: Pupils equal, round, reactive to light and accommodation. No scleral icterus. Extraocular muscles intact.  HEENT: Head atraumatic, normocephalic. Oropharynx and nasopharynx clear.  NECK:  Supple, no jugular venous distention. No thyroid enlargement, no tenderness.  LUNGS: Normal breath sounds bilaterally, no wheezing, rales,rhonchi or crepitation. No use of  accessory muscles of respiration.  CARDIOVASCULAR: S1, S2 normal. No murmurs, rubs, or gallops. Distant to auscultation ABDOMEN: Soft, nontender, nondistended. Bowel sounds present. No organomegaly or mass.  EXTREMITIES: No pedal edema, cyanosis, or clubbing.  NEUROLOGIC: Cranial nerves II through XII are intact. Muscle strength 5/5 in all extremities. Sensation intact. Gait not checked.  PSYCHIATRIC: The patient is alert and oriented x 3.  SKIN: No obvious rash, lesion, or ulcer.   ORDERS/RESULTS REVIEWED:   CBC  Recent Labs Lab 10/23/14 1913  WBC 9.9  HGB 15.7  HCT 44.7  PLT 190  MCV 91.7  MCH 32.2  MCHC 35.1  RDW 13.4   ------------------------------------------------------------------------------------------------------------------  Chemistries   Recent Labs Lab 10/23/14 1913 10/23/14 2352  NA 137 135  K 2.5* 2.8*  CL 100* 98*  CO2 28 29  GLUCOSE 304* 313*  BUN 15 16  CREATININE 1.28* 1.17  CALCIUM 8.9 8.7*  MG 1.6*  --   AST 22  --   ALT 16*  --   ALKPHOS 86  --   BILITOT 0.4  --    ------------------------------------------------------------------------------------------------------------------ estimated creatinine clearance is 104.8 mL/min (by C-G formula based on Cr of 1.17). ------------------------------------------------------------------------------------------------------------------ No results for input(s): TSH, T4TOTAL, T3FREE, THYROIDAB in the last 72 hours.  Invalid input(s): FREET3  Cardiac Enzymes  Recent Labs Lab 10/23/14 1913 10/23/14 2352 10/24/14 0609  TROPONINI <0.03 <0.03 <0.03   ------------------------------------------------------------------------------------------------------------------ Invalid input(s): POCBNP ---------------------------------------------------------------------------------------------------------------  RADIOLOGY: Dg Chest 2 View  10/23/2014   CLINICAL DATA:  54 year old male with intermittent chest  pain for 3 weeks, increasing today. Initial encounter.  EXAM: CHEST  2 VIEW  COMPARISON:  12/01/2008  FINDINGS: The right lung base is not entirely included on  the PA view. Lung volumes are stable and within normal limits. Normal cardiac size and mediastinal contours. Visualized tracheal air column is within normal limits. No pneumothorax or pulmonary edema. No pleural effusion or confluent pulmonary opacity. No acute osseous abnormality identified.  IMPRESSION: No acute cardiopulmonary abnormality.   Electronically Signed   By: Genevie Ann M.D.   On: 10/23/2014 19:31   Ct Angio Chest Pe W/cm &/or Wo Cm  10/23/2014   CLINICAL DATA:  Chest pain and shortness of breath  EXAM: CT ANGIOGRAPHY CHEST WITH CONTRAST  TECHNIQUE: Multidetector CT imaging of the chest was performed using the standard protocol during bolus administration of intravenous contrast. Multiplanar CT image reconstructions and MIPs were obtained to evaluate the vascular anatomy.  CONTRAST:  33mL OMNIPAQUE IOHEXOL 350 MG/ML SOLN  COMPARISON:  Chest radiograph October 23, 2014  FINDINGS: There is no demonstrable pulmonary embolus. There is no thoracic aortic aneurysm or dissection. Visualized great vessels appear unremarkable.  There is mild dependent atelectasis in the left and to a lesser extent right lung bases. There is no lung edema or consolidation. There is slight lower lobe bronchiectatic change bilaterally.  Thyroid appears unremarkable. There is no demonstrable adenopathy. The pericardium is not thickened.  Visualized upper abdominal structures appear unremarkable.  There are no blastic or lytic bone lesions. There is degenerative change in mid thoracic region.  Review of the MIP images confirms the above findings.  IMPRESSION: No edema or consolidation.  No demonstrable pulmonary embolus.   Electronically Signed   By: Lowella Grip III M.D.   On: 10/23/2014 21:37    EKG:  Orders placed or performed during the hospital encounter of 10/23/14   . ED EKG (<87mins upon arrival to the ED)  . ED EKG (<15mins upon arrival to the ED)    ASSESSMENT AND PLAN:  Principal Problem:   Chest pain, central Active Problems:   Hypokalemia 1. Chest pain,  Atypical, however, patient has a significant risk factors including hyperlipidemia, obesity, severe sleep apnea as well as diabetes mellitus , start patient on metoprolol as well as Imdur, will get cardiology consultation for further recommendations. Patient has stress test scheduled for him in 2 days in [place]. We are  going to ambulate patient and it is symptoms recur cardiologist may take the him to cardiac catheterization lab. If however his symptoms are none, we will be  likely able to discharge him home with follow-up with [facility] stress test lab and cardiologist. Cardiac enzymes are negative. Continue statin and get lipid panel checked 2. Obesity with known history of obstructive sleep apnea. Discussed this patient for prolonged period of time and weight loss was recommended after cardiac evaluation 3. Hypokalemia, supplement orally and get magnesium level checked 4. Renal insufficiency, resolved 5. Diabetes mellitus, poorly controlled with glucose levels at 300s. Hemoglobin A1c. Discussed weight loss as well as diet. 6 . Tobacco abuse. Long discussion with patient approximately 45 minutes. Nicotine replacement therapy was offered, however, refused   Management plans discussed with the patient, family and they are in agreement.   DRUG ALLERGIES: No Known Allergies  CODE STATUS:     Code Status Orders        Start     Ordered   10/23/14 2235  Full code   Continuous     10/23/14 2234      TOTAL TIME TAKING CARE OF THIS PATIENT: 40 minutes.    Theodoro Grist M.D on 10/24/2014 at 9:50 AM  Between 7am  to 6pm - Pager - [phone]  After 6pm go to [email or web address] - password [email or web address] [facility]  [facility]  Office  [phone]  CC: Primary care physician; No  primary care provider on file.

## 2014-10-24 NOTE — Progress Notes (Signed)
   10/24/14 1000  Clinical Encounter Type  Visited With Patient and family together  Visit Type Initial  Referral From Chaplain  Consult/Referral To Chaplain  Spiritual Encounters  Spiritual Needs Prayer  Stress Factors  Patient Stress Factors Exhausted;Major life changes;Health changes  Family Stress Factors Family relationships;Health changes  Met with patient during rounds. Visited with patient & wife. Listened to health concerns and provided spiritual counseling & prayer. Chap. Evelia Waskey G. Hanska

## 2014-10-24 NOTE — Progress Notes (Signed)
Pt in NAd, skin warm and dry, respirations even and unlabored.  Family at the bedside.  Pt denies any pain or discomfort at this time.  SR per monitor, VSS.  Pt for Lexiscan tomorrow morning, NPO after midnight tonight,  Continued monitoring.

## 2014-10-25 ENCOUNTER — Observation Stay (HOSPITAL_BASED_OUTPATIENT_CLINIC_OR_DEPARTMENT_OTHER): Payer: Medicare Other

## 2014-10-25 ENCOUNTER — Telehealth: Payer: Self-pay

## 2014-10-25 ENCOUNTER — Observation Stay (HOSPITAL_BASED_OUTPATIENT_CLINIC_OR_DEPARTMENT_OTHER)
Admit: 2014-10-25 | Discharge: 2014-10-25 | Disposition: A | Discharge status: Home / Self Care | Payer: Medicare Other | Attending: Internal Medicine | Admitting: Internal Medicine

## 2014-10-25 DIAGNOSIS — R079 Chest pain, unspecified: Secondary | ICD-10-CM

## 2014-10-25 DIAGNOSIS — R0600 Dyspnea, unspecified: Secondary | ICD-10-CM

## 2014-10-25 LAB — NM MYOCAR MULTI W/SPECT W/WALL MOTION / EF
CHL CUP NUCLEAR SSS: 2
CHL CUP RESTING HR STRESS: 85 {beats}/min
LV dias vol: 143 mL
LV sys vol: 64 mL
NUC STRESS TID: 1.16
Peak HR: 110 {beats}/min
Percent HR: 66 %
SDS: 1
SRS: 5

## 2014-10-25 LAB — CBC
HEMATOCRIT: 40 % (ref 40.0–52.0)
Hemoglobin: 13.9 g/dL (ref 13.0–18.0)
MCH: 32.4 pg (ref 26.0–34.0)
MCHC: 34.8 g/dL (ref 32.0–36.0)
MCV: 93.1 fL (ref 80.0–100.0)
Platelets: 175 10*3/uL (ref 150–440)
RBC: 4.29 MIL/uL — AB (ref 4.40–5.90)
RDW: 13.2 % (ref 11.5–14.5)
WBC: 7.5 10*3/uL (ref 3.8–10.6)

## 2014-10-25 LAB — LIPID PANEL
CHOL/HDL RATIO: 7.1 ratio
CHOLESTEROL: 178 mg/dL (ref 0–200)
HDL: 25 mg/dL — ABNORMAL LOW (ref >40)
LDL Cholesterol: UNDETERMINED mg/dL (ref 0–99)
Triglycerides: 471 mg/dL — ABNORMAL HIGH (ref <150)
VLDL: UNDETERMINED mg/dL (ref 0–40)

## 2014-10-25 LAB — GLUCOSE, CAPILLARY
GLUCOSE-CAPILLARY: 236 mg/dL — AB (ref 65–99)
Glucose-Capillary: 194 mg/dL — ABNORMAL HIGH (ref 65–99)

## 2014-10-25 LAB — HEMOGLOBIN A1C: Hgb A1c MFr Bld: 9.1 % — ABNORMAL HIGH (ref 4.0–6.0)

## 2014-10-25 MED ORDER — TECHNETIUM TC 99M SESTAMIBI - CARDIOLITE
10.0000 | Freq: Once | INTRAVENOUS | Status: AC | PRN
  Administered 2014-10-25: 13.66 via INTRAVENOUS

## 2014-10-25 MED ORDER — TECHNETIUM TC 99M SESTAMIBI - CARDIOLITE
30.0000 | Freq: Once | INTRAVENOUS | Status: AC | PRN
  Administered 2014-10-25: 11:00:00 32.84 via INTRAVENOUS

## 2014-10-25 MED ORDER — REGADENOSON 0.4 MG/5ML IV SOLN
0.4000 mg | Freq: Once | INTRAVENOUS | Status: AC
  Administered 2014-10-25: 0.4 mg via INTRAVENOUS
  Filled 2014-10-25: qty 5

## 2014-10-25 MED ORDER — LIVING WELL WITH DIABETES BOOK
Freq: Once | Status: AC
  Administered 2014-10-25: 10:00:00
  Filled 2014-10-25: qty 1

## 2014-10-25 MED ORDER — PERFLUTREN LIPID MICROSPHERE
1.0000 mL | INTRAVENOUS | Status: AC | PRN
  Administered 2014-10-25: 7 mL via INTRAVENOUS

## 2014-10-25 NOTE — Telephone Encounter (Signed)
Patient contacted regarding discharge from [facility] on 10/25/14.  Patient understands to follow up with Dr. Rockey Situ on 11/15/14 at 11:00 at [facility]. Patient understands discharge instructions? yes Patient understands medications and regiment? yes Patient understands to bring all medications to this visit? yes

## 2014-10-25 NOTE — Discharge Instructions (Signed)
Pleurisy °Pleurisy is redness, puffiness (swelling), and soreness (inflammation) of the lining of the lungs. It can be hard to breathe and hurt to breathe. Coughing or deep breathing will make it hurt more. It is often caused by an existing infection or disease.  °HOME CARE °· Only take medicine as told by your doctor. °· Only take antibiotic medicine as directed. Make sure to finish it even if you start to feel better. °GET HELP RIGHT AWAY IF:  °· Your lips, fingernails, or toenails are blue or dark. °· You cough up blood. °· You have a hard time breathing. °· Your pain is not controlled with medicine or it lasts for more than 1 week. °· Your pain spreads (radiates) into your neck, arms, or jaw. °· You are short of breath or wheezing. °· You develop a fever, rash, throw up (vomit), or faint. °MAKE SURE YOU:  °· Understand these instructions. °· Will watch your condition. °· Will get help right away if you are not doing well or get worse. °Document Released: 04/05/2008 Document Revised: 12/24/2012 Document Reviewed: 10/05/2012 °[employer]® Patient Information ©2015 [employer]. This information is not intended to replace advice given to you by your health care provider. Make sure you discuss any questions you have with your health care provider. ° °

## 2014-10-25 NOTE — Progress Notes (Signed)
*  PRELIMINARY RESULTS* Echocardiogram 2D Echocardiogram has been performed. Definity was used to enhance endocardial borders.  Patrick Yoder 10/25/2014, 12:38 PM

## 2014-10-25 NOTE — Care Management (Addendum)
Order present for CM assessment for discharge planning.  Patient admitted with shortness of breath.   Patient presents from home and independent in all adls.   Has health insurance.  Denies issues accessing medical care, obtaining medications, maintaining housing, utilities and food.   Does state that he is trying to find new PCP in [place].  He is not happy with his PCP at [facility].

## 2014-10-25 NOTE — Progress Notes (Addendum)
Inpatient Diabetes Program Recommendations  AACE/ADA: New Consensus Statement on Inpatient Glycemic Control (2013)  Target Ranges:  Prepandial:   less than 140 mg/dL      Peak postprandial:   less than 180 mg/dL (1-2 hours)      Critically ill patients:  140 - 180 mg/dL     Results for YACOUB, DILTZ (MRN [id number]) as of 10/25/2014 09:28  Ref. Range 10/24/2014 07:25 10/24/2014 11:09 10/24/2014 16:20 10/24/2014 19:47  Glucose-Capillary Latest Ref Range: 65-99 mg/dL 264 (H) 244 (H) 247 (H) 284 (H)    Results for JACORIE, ERNSBERGER (MRN [id number]) as of 10/25/2014 09:28  Ref. Range 10/25/2014 07:49  Glucose-Capillary Latest Ref Range: 65-99 mg/dL 236 (H)     Admit with: CP  History: DM, HTN  Home DM Meds: Metformin 1000 mg bid  Current DM Orders: Novolog Moderate SSI (0-15 units) tid ac + HS    **Note patient for Lexiscan Myoview today.  **Note A1c pending.  **Glucose levels elevated since admission.  Novolog SSI initiated yesterday.   MD- Please consider adding basal insulin to in-hospital insulin regimen to help better manage glucose levels for now-  Lantus 20 units QHS (0.15 units/kg dosing to start)  I have ordered a dietitian consult for this patient to reinforce DM diet education  I will speak with patient about importance of glucose control as well.   Addendum 1340: Spoke with patient this afternoon about the importance of good blood glucose control.  Discussed chronic and acute complications of uncontrolled blood glucose levels and the need for monitoring and regular medical care.  Reviewed s/sxs of hyperglycemia which patient endorsed that he has been having at home.  Also discussed basic DM nutritional principles.  Encouraged patient to avoid beverages with sugar (regular soda, sweet tea, lemonade, fruit juice) and to consume mostly water.  Discussed what foods contain carbohydrates and how carbohydrates affect the body's blood sugar levels.  Encouraged patient  to be careful with his portion sizes (especially grains, starchy vegetables, and fruits).    Patient was not very receptive to the information I provided to him.  Patient told me he was unhappy with his PCP in [facility] and that his doctor just wants to "throw medicine at him".  "Those doctors just sit at their computers and give you more pills to take".  Patient went on to tell me that he was appreciative of my visit but that when he leaves he does not plan to make any changes to his diet nor his lifestyle.  Attempted to make short term goals with patient, however, patient declined any ideas I had for him.  Encouraged patient to seek medical care under a new physician if he is unhappy with the care he is receiving in [facility].  Family member present in the room told me she would help patient find a new MD if patient desired.  Gave patient a Living Well with diabetes booklet and encouraged him to review this book at his own pace.     Will follow Wyn Quaker RN, MSN, CDE Diabetes Coordinator Inpatient Glycemic Control Team Team Pager: [phone] (8a-5p)

## 2014-10-25 NOTE — Discharge Summary (Signed)
[facility] [place] NAME: Patrick Yoder    MR#:  [id number]  DATE OF BIRTH:  [birth year]  DATE OF ADMISSION:  10/23/2014 ADMITTING PHYSICIAN: Lytle Butte, MD  DATE OF DISCHARGE: 10/25/2014  3:09 PM  PRIMARY CARE PHYSICIAN: [facility]   ADMISSION DIAGNOSIS:  Hypokalemia [E87.6] Weakness [R53.1] Tachycardia [R00.0] Chest pain, unspecified chest pain type [R07.9]  DISCHARGE DIAGNOSIS:  Principal Problem:   Chest pain, central Active Problems:   Hypokalemia   Obesity   Renal insufficiency   Diabetes   Tobacco use disorder  SECONDARY DIAGNOSIS:   Past Medical History  Diagnosis Date  . Diabetes mellitus without complication   . Hypertension   . Cancer    HOSPITAL COURSE:  Patient is a 54 year old male with above-mentioned medical problem was admitted for chest pain.  See Dr. Samantha Crimes dictated history and physical for further details.  Patient was ruled out with 3 negative serial troponins, but due to ongoing symptoms of chest pain and intermittent shortness of breath.  He was kept in the hospital for to have a Myoview done along with 2-D echocardiogram, which was both performed and were negative.  Patient's pain was thought to be pleuritic in nature and was recommended to use nonsteroidal anti-inflammatory medication in the form of ibuprofen or any similar compounds.  He is agreeable to do so.  He is overall feeling much better and is being discharged home in stable condition.  He is agreeable with discharge plans.  DISCHARGE CONDITIONS:    Stable  CONSULTS OBTAINED:  Treatment Team:  Lytle Butte, MD Minna Merritts, MD  DRUG ALLERGIES:  No Known Allergies  DISCHARGE MEDICATIONS:   Discharge Medication List as of 10/25/2014  2:48 PM    START taking these medications   Details  atorvastatin (LIPITOR) 40 MG tablet Take 1 tablet (40 mg total) by mouth daily at 6 PM., Starting 10/24/2014, Until  Discontinued, Normal    isosorbide mononitrate (IMDUR) 30 MG 24 hr tablet Take 1 tablet (30 mg total) by mouth daily., Starting 10/24/2014, Until Discontinued, Normal    magnesium oxide (MAG-OX) 400 (241.3 MG) MG tablet Take 1 tablet (400 mg total) by mouth 2 (two) times daily., Starting 10/24/2014, Until Discontinued, Normal    metoprolol tartrate (LOPRESSOR) 25 MG tablet Take 1 tablet (25 mg total) by mouth 2 (two) times daily., Starting 10/24/2014, Until Discontinued, Normal    nitroGLYCERIN (NITROSTAT) 0.4 MG SL tablet Place 1 tablet (0.4 mg total) under the tongue every 5 (five) minutes as needed for chest pain., Starting 10/24/2014, Until Discontinued, Normal      CONTINUE these medications which have NOT CHANGED   Details  albuterol (PROVENTIL HFA;VENTOLIN HFA) 108 (90 BASE) MCG/ACT inhaler Inhale into the lungs every 4 (four) hours as needed for wheezing or shortness of breath., Until Discontinued, Historical Med    amoxicillin-clavulanate (AUGMENTIN) 875-125 MG per tablet Take 1 tablet by mouth 2 (two) times daily., Starting 10/23/2014, Until Mon 11/01/14, Historical Med    aspirin 81 MG tablet Take 81 mg by mouth daily., Until Discontinued, Historical Med    Fluticasone-Salmeterol (ADVAIR) 250-50 MCG/DOSE AEPB Inhale 1 puff into the lungs 2 (two) times daily., Until Discontinued, Historical Med    gabapentin (NEURONTIN) 800 MG tablet Take 800 mg by mouth 4 (four) times daily., Until Discontinued, Historical Med    metFORMIN (GLUCOPHAGE) 1000 MG tablet Take 1,000 mg by mouth 2 (two) times daily with a  meal., Until Discontinued, Historical Med    omeprazole (PRILOSEC) 20 MG capsule Take 20 mg by mouth 2 (two) times daily before a meal., Until Discontinued, Historical Med    traMADol (ULTRAM) 50 MG tablet Take by mouth every 6 (six) hours as needed for moderate pain., Until Discontinued, Historical Med    traZODone (DESYREL) 100 MG tablet Take 100 mg by mouth at bedtime., Until  Discontinued, Historical Med      STOP taking these medications     chlorthalidone (HYGROTON) 25 MG tablet        DISCHARGE INSTRUCTIONS:    DIET:  Cardiac diet  DISCHARGE CONDITION:  Good  ACTIVITY:  Activity as tolerated  OXYGEN:  Home Oxygen: No.   Oxygen Delivery: room air  DISCHARGE LOCATION:  home   If you experience worsening of your admission symptoms, develop shortness of breath, life threatening emergency, suicidal or homicidal thoughts you must seek medical attention immediately by calling 911 or calling your MD immediately  if symptoms less severe.  You Must read complete instructions/literature along with all the possible adverse reactions/side effects for all the Medicines you take and that have been prescribed to you. Take any new Medicines after you have completely understood and accpet all the possible adverse reactions/side effects.   Please note  You were cared for by a hospitalist during your hospital stay. If you have any questions about your discharge medications or the care you received while you were in the hospital after you are discharged, you can call the unit and asked to speak with the hospitalist on call if the hospitalist that took care of you is not available. Once you are discharged, your primary care physician will handle any further medical issues. Please note that NO REFILLS for any discharge medications will be authorized once you are discharged, as it is imperative that you return to your primary care physician (or establish a relationship with a primary care physician if you do not have one) for your aftercare needs so that they can reassess your need for medications and monitor your lab values.    On the day of Discharge:   VITAL SIGNS:  Blood pressure 117/75, pulse 85, temperature 97.9 F (36.6 C), temperature source Oral, resp. rate 18, height 6\' 2"  (1.88 m), weight 133.539 kg (294 lb 6.4 oz), SpO2 97 %.  I/O:   Intake/Output  Summary (Last 24 hours) at 10/25/14 1611 Last data filed at 10/25/14 0808  Gross per 24 hour  Intake    240 ml  Output    200 ml  Net     40 ml    PHYSICAL EXAMINATION:  GENERAL:  54 y.o.-year-old patient lying in the bed with no acute distress.  EYES: Pupils equal, round, reactive to light and accommodation. No scleral icterus. Extraocular muscles intact.  HEENT: Head atraumatic, normocephalic. Oropharynx and nasopharynx clear.  NECK:  Supple, no jugular venous distention. No thyroid enlargement, no tenderness.  LUNGS: Normal breath sounds bilaterally, no wheezing, rales,rhonchi or crepitation. No use of accessory muscles of respiration.  CARDIOVASCULAR: S1, S2 normal. No murmurs, rubs, or gallops.  ABDOMEN: Soft, non-tender, non-distended. Bowel sounds present. No organomegaly or mass.  EXTREMITIES: No pedal edema, cyanosis, or clubbing.  NEUROLOGIC: Cranial nerves II through XII are intact. Muscle strength 5/5 in all extremities. Sensation intact. Gait not checked.  PSYCHIATRIC: The patient is alert and oriented x 3.  SKIN: No obvious rash, lesion, or ulcer.   DATA REVIEW:   CBC  Recent Labs Lab 10/25/14 0456  WBC 7.5  HGB 13.9  HCT 40.0  PLT 175    Chemistries   Recent Labs Lab 10/23/14 1913  10/24/14 1441  NA 137  < > 136  K 2.5*  < > 3.8  CL 100*  < > 101  CO2 28  < > 27  GLUCOSE 304*  < > 273*  BUN 15  < > 12  CREATININE 1.28*  < > 1.27*  CALCIUM 8.9  < > 8.7*  MG 1.6*  --  2.1  AST 22  --   --   ALT 16*  --   --   ALKPHOS 86  --   --   BILITOT 0.4  --   --   < > = values in this interval not displayed.  Cardiac Enzymes  Recent Labs Lab 10/24/14 1123  TROPONINI <0.03    Microbiology Results  No results found for this or any previous visit.  RADIOLOGY:  Dg Chest 2 View  10/23/2014   CLINICAL DATA:  54 year old male with intermittent chest pain for 3 weeks, increasing today. Initial encounter.  EXAM: CHEST  2 VIEW  COMPARISON:  12/01/2008   FINDINGS: The right lung base is not entirely included on the PA view. Lung volumes are stable and within normal limits. Normal cardiac size and mediastinal contours. Visualized tracheal air column is within normal limits. No pneumothorax or pulmonary edema. No pleural effusion or confluent pulmonary opacity. No acute osseous abnormality identified.  IMPRESSION: No acute cardiopulmonary abnormality.   Electronically Signed   By: Genevie Ann M.D.   On: 10/23/2014 19:31   Ct Angio Chest Pe W/cm &/or Wo Cm  10/23/2014   CLINICAL DATA:  Chest pain and shortness of breath  EXAM: CT ANGIOGRAPHY CHEST WITH CONTRAST  TECHNIQUE: Multidetector CT imaging of the chest was performed using the standard protocol during bolus administration of intravenous contrast. Multiplanar CT image reconstructions and MIPs were obtained to evaluate the vascular anatomy.  CONTRAST:  80mL OMNIPAQUE IOHEXOL 350 MG/ML SOLN  COMPARISON:  Chest radiograph October 23, 2014  FINDINGS: There is no demonstrable pulmonary embolus. There is no thoracic aortic aneurysm or dissection. Visualized great vessels appear unremarkable.  There is mild dependent atelectasis in the left and to a lesser extent right lung bases. There is no lung edema or consolidation. There is slight lower lobe bronchiectatic change bilaterally.  Thyroid appears unremarkable. There is no demonstrable adenopathy. The pericardium is not thickened.  Visualized upper abdominal structures appear unremarkable.  There are no blastic or lytic bone lesions. There is degenerative change in mid thoracic region.  Review of the MIP images confirms the above findings.  IMPRESSION: No edema or consolidation.  No demonstrable pulmonary embolus.   Electronically Signed   By: Lowella Grip III M.D.   On: 10/23/2014 21:37   Nm Myocar Multi W/spect W/wall Motion / Ef  10/25/2014    Pharmacological myocardial perfusion imaging study with no significant  ischemia.  There was no EKG changes concerning  for ischemia at stress or in  recovery.  The left ventricular ejection fraction is normal (55-65%). No wall  motion abnormality.  GI uptake artifact noted.  This is a low risk study.    Management plans discussed with the patient, family and they are in agreement.  CODE STATUS: Full code  TOTAL TIME TAKING CARE OF THIS PATIENT: 55 minutes.    [facility], Claire Dolores M.D on 10/25/2014 at 4:11 PM  Between 7am  to 6pm - Pager - [phone]  After 6pm go to [email or web address] - password [email or web address] [facility]  [facility]  Office  [phone]  CC: Primary care physician; No primary care provider on file. Fay Records, MD

## 2014-10-25 NOTE — Clinical Social Work Note (Signed)
CSW notified RNCM of pt's need for medication assistance.  No CSW needs at this time.  CSW signning off.

## 2014-10-25 NOTE — Telephone Encounter (Signed)
-----   Message from Jonette Eva sent at 10/25/2014  2:38 PM EDT ----- Regarding: tcm/hp Pt is coming July 11th at 11am  Pt saw Dr Rockey Situ in ED

## 2014-10-26 NOTE — Progress Notes (Signed)
Pt called back on 6/21 at 1630 to request assistance with test strips and a meter, nann was consulted. No further interventions were allowed.

## 2014-10-26 NOTE — Care Management (Signed)
Informed by charge nurse that patient has called requesting adamantly that he be provided with a glucose monitor and strips.  Patient was seen by this CM 6/20 and patient denied any issues obtaining/accessing anything relating to his medical care.  His diabetes is not a new diagnosis according to the H/P.   Patient discharged home on his routine oral glycemic agent.  Patient's wife is attempting to find him a PCP in [place] as he is unhappy with his PCP at [facility].  Charge nurse instructed him to call his [facility] for script for a meter and script and he relayed to her "he has rotated out."   CM called patient and there was no answer x 2 when call number and there was no voicemail.

## 2014-11-15 ENCOUNTER — Encounter: Payer: Medicare Other | Admitting: Cardiovascular Disease

## 2016-07-31 IMAGING — CR DG CHEST 2V
2 series · 2 of 2 positions shown · non-contrast
Comparison: 12/01/2008

CLINICAL DATA: 54-year-old male with intermittent chest pain for 3
weeks, increasing today. Initial encounter.

EXAM:
CHEST  2 VIEW

[chest pa]
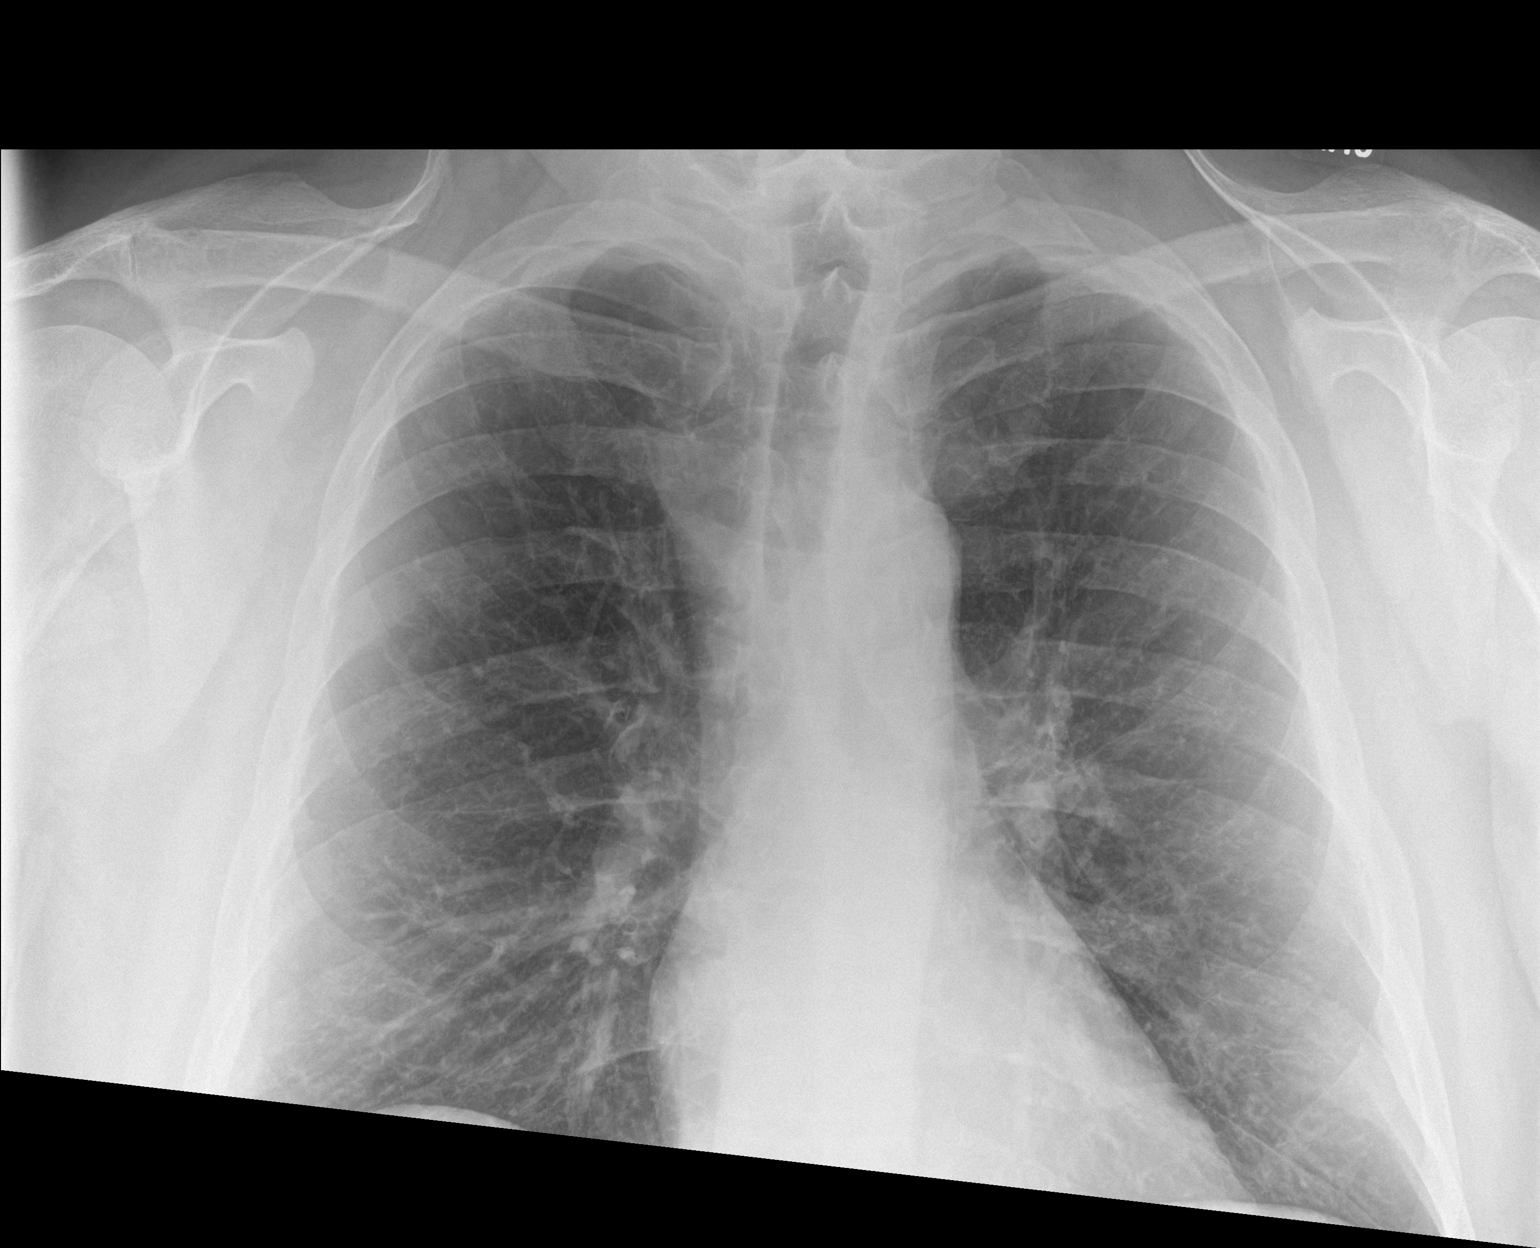

[chest lat]
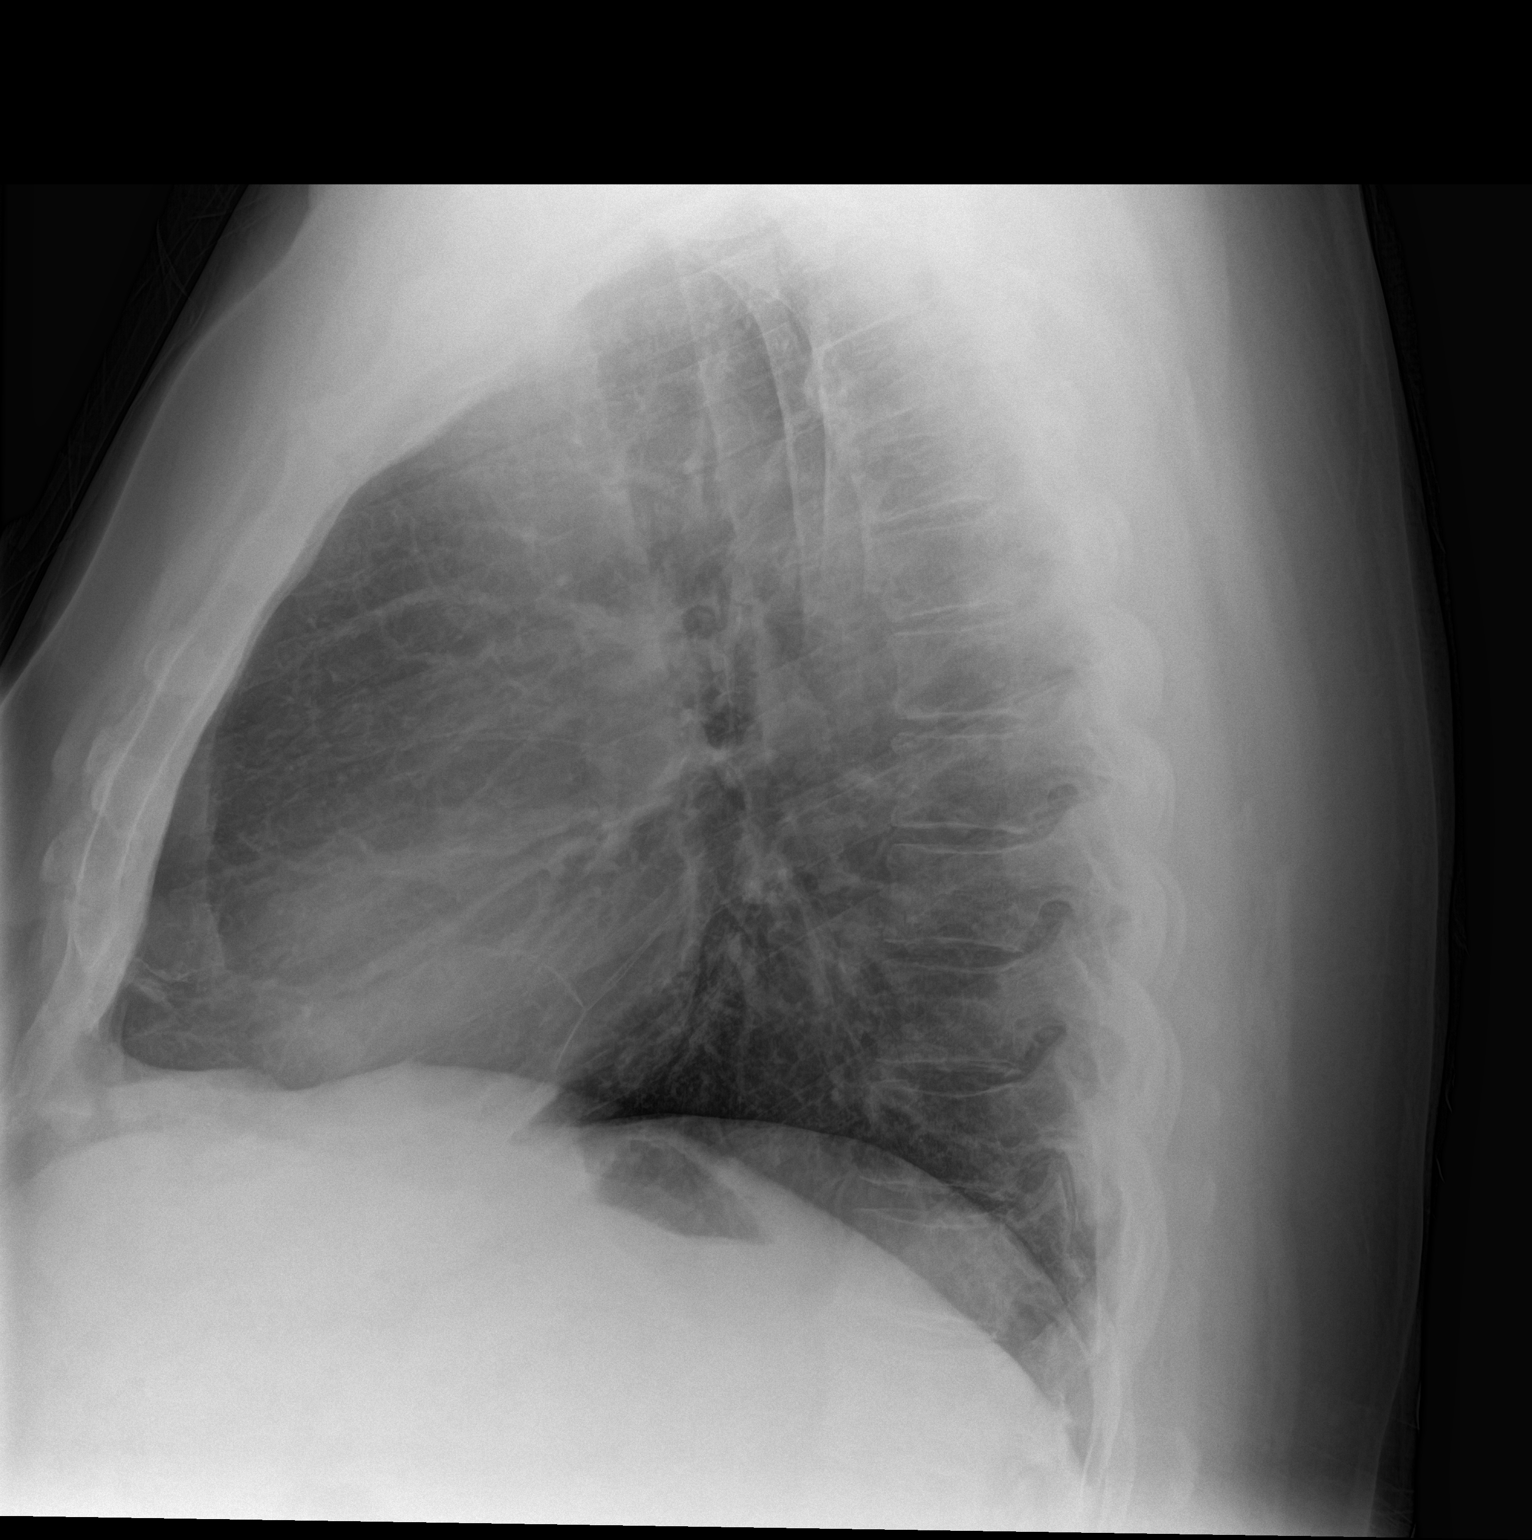

[2 of 2 positions shown; findings below may reference images not displayed]

FINDINGS: The right lung base is not entirely included on the PA view. Lung
volumes are stable and within normal limits. Normal cardiac size and
mediastinal contours. Visualized tracheal air column is within
normal limits. No pneumothorax or pulmonary edema. No pleural
effusion or confluent pulmonary opacity. No acute osseous
abnormality identified.
IMPRESSION: No acute cardiopulmonary abnormality.
